# Patient Record
Sex: Male | Born: 1985 | Race: Black or African American | Hispanic: No | Marital: Single | Smoking: Current every day smoker
Health system: Southern US, Community
[De-identification: ages and names within clinical notes are randomized; demographics above are authoritative.]

---
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
## 2012-08-10 ENCOUNTER — Emergency Department (HOSPITAL_BASED_OUTPATIENT_CLINIC_OR_DEPARTMENT_OTHER)
Admission: EM | Admit: 2012-08-10 | Discharge: 2012-08-10 | Disposition: A | Discharge status: Home / Self Care | Payer: Self-pay | Attending: Emergency Medicine | Admitting: Emergency Medicine

## 2012-08-10 ENCOUNTER — Encounter (HOSPITAL_BASED_OUTPATIENT_CLINIC_OR_DEPARTMENT_OTHER): Payer: Self-pay | Admitting: *Deleted

## 2012-08-10 DIAGNOSIS — H109 Unspecified conjunctivitis: Secondary | ICD-10-CM

## 2012-08-10 DIAGNOSIS — F17200 Nicotine dependence, unspecified, uncomplicated: Secondary | ICD-10-CM | POA: Insufficient documentation

## 2012-08-10 MED ORDER — FLUORESCEIN SODIUM 1 MG OP STRP
ORAL_STRIP | OPHTHALMIC | Status: AC
  Filled 2012-08-10: qty 1

## 2012-08-10 MED ORDER — TETRACAINE HCL 0.5 % OP SOLN
OPHTHALMIC | Status: AC
  Filled 2012-08-10: qty 2

## 2012-08-10 MED ORDER — ERYTHROMYCIN 5 MG/GM OP OINT: TOPICAL_OINTMENT | Freq: Four times a day (QID) | OPHTHALMIC | Status: DC

## 2012-08-10 NOTE — ED Provider Notes (Addendum)
History     CSN: [id number]  Arrival date & time 08/10/12  0016   First MD Initiated Contact with Patient 08/10/12 0156      Chief Complaint  Patient presents with  . Eye Pain    (Consider location/radiation/quality/duration/timing/severity/associated sxs/prior treatment) Patient is a 26 y.o. male presenting with eye pain and eye problem. The history is provided by the patient.  Eye Pain This is a new problem. The current episode started 12 to 24 hours ago. The problem occurs constantly. The problem has not changed since onset.Pertinent negatives include no chest pain, no abdominal pain, no headaches and no shortness of breath. Nothing aggravates the symptoms. Nothing relieves the symptoms. He has tried nothing for the symptoms. The treatment provided no relief.  Eye Problem  This is a new problem. The current episode started 12 to 24 hours ago. The problem occurs constantly. The problem has not changed since onset.The left eye is affected.There was no injury mechanism. The pain is mild. There is no history of trauma to the eye. There is no known exposure to pink eye. He does not wear contacts. Associated symptoms include discharge. Pertinent negatives include no double vision. He has tried nothing for the symptoms. The treatment provided no relief.    History reviewed. No pertinent past medical history.  History reviewed. No pertinent past surgical history.  No family history on file.  History  Substance Use Topics  . Smoking status: Current Every Day Smoker  . Smokeless tobacco: Not on file  . Alcohol Use: No      Review of Systems  Eyes: Positive for pain and discharge. Negative for double vision and visual disturbance.  Respiratory: Negative for shortness of breath.   Cardiovascular: Negative for chest pain.  Gastrointestinal: Negative for abdominal pain.  Neurological: Negative for headaches.  All other systems reviewed and are negative.    Allergies  Review of  patient's allergies indicates no known allergies.  Home Medications  No current outpatient prescriptions on file.  BP 116/66  Pulse 62  Temp 97.9 F (36.6 C) (Oral)  Resp 16  Wt 160 lb (72.576 kg)  SpO2 100%  Physical Exam  Constitutional: He is oriented to person, place, and time. He appears well-developed and well-nourished.  HENT:  Head: Normocephalic and atraumatic.  Mouth/Throat: Oropharynx is clear and moist.  Eyes: Conjunctivae normal and EOM are normal. Pupils are equal, round, and reactive to light.  Slit lamp exam:      The right eye shows no corneal abrasion and no corneal flare.       The left eye shows no corneal abrasion and no corneal flare.  Neck: Normal range of motion. Neck supple.  Cardiovascular: Normal rate, regular rhythm and intact distal pulses.   Pulmonary/Chest: Effort normal and breath sounds normal. He has no wheezes. He has no rales.  Abdominal: Soft. Bowel sounds are normal. There is no tenderness. There is no rebound.  Musculoskeletal: Normal range of motion. He exhibits no edema.  Neurological: He is alert and oriented to person, place, and time.  Skin: Skin is warm and dry.  Psychiatric: He has a normal mood and affect.    ED Course  Procedures (including critical care time)  Labs Reviewed - No data to display No results found.   No diagnosis found.    MDM  Will treat as conjunctivitis follow up in 24 hours with ophthalmology patient verbalizes understanding and agrees to follow up  Jasmine Awe, MD 08/10/12 0248  Kasyn Rolph K Ashleigh Arya-Rasch, MD 08/10/12 [phone]

## 2012-08-10 NOTE — ED Notes (Signed)
Pt c/o left eye pain and drainage since yesterday.

## 2013-06-16 ENCOUNTER — Encounter (HOSPITAL_BASED_OUTPATIENT_CLINIC_OR_DEPARTMENT_OTHER): Payer: Self-pay | Admitting: Emergency Medicine

## 2013-06-16 ENCOUNTER — Emergency Department (HOSPITAL_BASED_OUTPATIENT_CLINIC_OR_DEPARTMENT_OTHER)
Admission: EM | Admit: 2013-06-16 | Discharge: 2013-06-16 | Disposition: A | Discharge status: Home / Self Care | Payer: Self-pay | Attending: Emergency Medicine | Admitting: Emergency Medicine

## 2013-06-16 DIAGNOSIS — F17200 Nicotine dependence, unspecified, uncomplicated: Secondary | ICD-10-CM | POA: Insufficient documentation

## 2013-06-16 DIAGNOSIS — S41009A Unspecified open wound of unspecified shoulder, initial encounter: Secondary | ICD-10-CM | POA: Insufficient documentation

## 2013-06-16 DIAGNOSIS — S21209A Unspecified open wound of unspecified back wall of thorax without penetration into thoracic cavity, initial encounter: Secondary | ICD-10-CM | POA: Insufficient documentation

## 2013-06-16 DIAGNOSIS — T07XXXA Unspecified multiple injuries, initial encounter: Secondary | ICD-10-CM

## 2013-06-16 NOTE — ED Notes (Signed)
PA at bedside suturing pt. 

## 2013-06-16 NOTE — ED Notes (Signed)
HPD at the bedside
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
## 2013-06-17 NOTE — ED Provider Notes (Signed)
  Medical screening examination/treatment/procedure(s) were performed by non-physician practitioner and as supervising physician I was immediately available for consultation/collaboration.  EKG Interpretation   None          Gerhard Munch, MD 06/17/13 0007

## 2013-07-05 ENCOUNTER — Encounter (HOSPITAL_BASED_OUTPATIENT_CLINIC_OR_DEPARTMENT_OTHER): Payer: Self-pay | Admitting: Emergency Medicine

## 2013-07-05 ENCOUNTER — Emergency Department (HOSPITAL_BASED_OUTPATIENT_CLINIC_OR_DEPARTMENT_OTHER)
Admission: EM | Admit: 2013-07-05 | Discharge: 2013-07-05 | Disposition: A | Discharge status: Home / Self Care | Payer: Self-pay | Attending: Emergency Medicine | Admitting: Emergency Medicine

## 2013-07-05 DIAGNOSIS — B9789 Other viral agents as the cause of diseases classified elsewhere: Secondary | ICD-10-CM | POA: Insufficient documentation

## 2013-07-05 DIAGNOSIS — F17200 Nicotine dependence, unspecified, uncomplicated: Secondary | ICD-10-CM | POA: Insufficient documentation

## 2013-07-05 DIAGNOSIS — H9209 Otalgia, unspecified ear: Secondary | ICD-10-CM | POA: Insufficient documentation

## 2013-07-05 DIAGNOSIS — B349 Viral infection, unspecified: Secondary | ICD-10-CM

## 2013-07-05 LAB — RAPID STREP SCREEN (MED CTR MEBANE ONLY): Streptococcus, Group A Screen (Direct): NEGATIVE

## 2013-07-05 MED ORDER — IBUPROFEN 800 MG PO TABS
800.0000 mg | ORAL_TABLET | Freq: Once | ORAL | Status: AC
  Administered 2013-07-05: 800 mg via ORAL
  Filled 2013-07-05: qty 1

## 2013-07-05 MED ORDER — NAPROXEN 375 MG PO TABS: 375.0000 mg | ORAL_TABLET | Freq: Two times a day (BID) | ORAL | Status: DC

## 2013-07-05 MED ORDER — LORATADINE-PSEUDOEPHEDRINE ER 10-240 MG PO TB24: 1.0000 | ORAL_TABLET | Freq: Every day | ORAL | Status: DC

## 2013-07-05 NOTE — ED Provider Notes (Signed)
CSN: [id number]     Arrival date & time 07/05/13  0106 History   First MD Initiated Contact with Patient 07/05/13 0116     Chief Complaint  Patient presents with  . Sore Throat  . Otalgia   (Consider location/radiation/quality/duration/timing/severity/associated sxs/prior Treatment) Patient is a 27 y.o. male presenting with pharyngitis and ear pain. The history is provided by the patient.  Sore Throat This is a new problem. The current episode started yesterday. The problem occurs constantly. The problem has not changed since onset.Pertinent negatives include no chest pain, no abdominal pain, no headaches and no shortness of breath. Nothing aggravates the symptoms. Nothing relieves the symptoms. Treatments tried: throat spray. The treatment provided no relief.  Otalgia Location:  Left Behind ear:  No abnormality Quality:  Burning Severity:  Moderate Onset quality:  Gradual Duration:  2 days Timing:  Constant Progression:  Unchanged Relieved by:  Nothing Worsened by:  Nothing tried Ineffective treatments:  None tried Associated symptoms: no abdominal pain, no fever and no headaches   Risk factors: no recent travel and no chronic ear infection     History reviewed. No pertinent past medical history. History reviewed. No pertinent past surgical history. History reviewed. No pertinent family history. History  Substance Use Topics  . Smoking status: Current Every Day Smoker -- 0.50 packs/day    Types: Cigarettes  . Smokeless tobacco: Not on file  . Alcohol Use: 3.6 oz/week    6 Cans of beer per week     Comment: socially    Review of Systems  Constitutional: Negative for fever.  HENT: Positive for ear pain.   Respiratory: Negative for shortness of breath.   Cardiovascular: Negative for chest pain.  Gastrointestinal: Negative for abdominal pain.  Neurological: Negative for headaches.  All other systems reviewed and are negative.    Allergies  Review of patient's  allergies indicates no known allergies.  Home Medications   Current Outpatient Rx  Name  Route  Sig  Dispense  Refill  . erythromycin ophthalmic ointment   Left Eye   Place into the left eye every 6 (six) hours.   3.5 g   0    BP 120/78  Pulse 68  Temp(Src) 98.4 F (36.9 C) (Oral)  Resp 18  Ht 5\' 11"  (1.803 m)  Wt 150 lb (68.04 kg)  BMI 20.93 kg/m2  SpO2 98% Physical Exam  Constitutional: He is oriented to person, place, and time. He appears well-developed and well-nourished. No distress.  HENT:  Head: Normocephalic and atraumatic.  Right Ear: No swelling. No mastoid tenderness. Tympanic membrane is not injected, not perforated, not erythematous, not retracted and not bulging. No hemotympanum.  Left Ear: No swelling. No mastoid tenderness. Tympanic membrane is not injected, not perforated, not erythematous, not retracted and not bulging. No hemotympanum.  Mouth/Throat: Oropharynx is clear and moist.  Eyes: Conjunctivae are normal. Pupils are equal, round, and reactive to light.  Neck: Normal range of motion. Neck supple.  Cardiovascular: Normal rate, regular rhythm and intact distal pulses.   Pulmonary/Chest: Effort normal and breath sounds normal. He has no wheezes. He has no rales.  Abdominal: Soft. Bowel sounds are normal. There is no tenderness. There is no rebound and no guarding.  Musculoskeletal: Normal range of motion.  Lymphadenopathy:    He has no cervical adenopathy.  Neurological: He is alert and oriented to person, place, and time.  Skin: Skin is warm and dry.  Psychiatric: He has a normal mood and affect.  ED Course  Procedures (including critical care time) Labs Review Labs Reviewed  RAPID STREP SCREEN   Imaging Review No results found.  EKG Interpretation   None       MDM  No diagnosis found. Strep negative ears normal.  Will treat with decongestant and nsaids.  Symptoms likely viral    Aletheia Tangredi K Ashlye Oviedo-Rasch, MD 07/05/13 [phone]

## 2013-07-05 NOTE — ED Notes (Signed)
Pt states he has had a sore throat, difficulty swallowing, left sided ear pain, chills x2 days.

## 2013-07-07 LAB — CULTURE, GROUP A STREP

## 2014-06-25 ENCOUNTER — Encounter (HOSPITAL_BASED_OUTPATIENT_CLINIC_OR_DEPARTMENT_OTHER): Payer: Self-pay | Admitting: *Deleted

## 2014-06-25 DIAGNOSIS — Z79899 Other long term (current) drug therapy: Secondary | ICD-10-CM | POA: Insufficient documentation

## 2014-06-25 DIAGNOSIS — Z791 Long term (current) use of non-steroidal anti-inflammatories (NSAID): Secondary | ICD-10-CM | POA: Insufficient documentation

## 2014-06-25 DIAGNOSIS — R599 Enlarged lymph nodes, unspecified: Secondary | ICD-10-CM | POA: Insufficient documentation

## 2014-06-25 DIAGNOSIS — L02413 Cutaneous abscess of right upper limb: Secondary | ICD-10-CM | POA: Insufficient documentation

## 2014-06-25 DIAGNOSIS — Z720 Tobacco use: Secondary | ICD-10-CM | POA: Insufficient documentation

## 2014-06-25 NOTE — ED Notes (Signed)
Pt c/o abscess to right high x 1 week

## 2014-06-26 ENCOUNTER — Emergency Department (HOSPITAL_BASED_OUTPATIENT_CLINIC_OR_DEPARTMENT_OTHER)
Admission: EM | Admit: 2014-06-26 | Discharge: 2014-06-26 | Disposition: A | Discharge status: Home / Self Care | Payer: Self-pay | Attending: Emergency Medicine | Admitting: Emergency Medicine

## 2014-06-26 ENCOUNTER — Encounter (HOSPITAL_BASED_OUTPATIENT_CLINIC_OR_DEPARTMENT_OTHER): Payer: Self-pay | Admitting: Emergency Medicine

## 2014-06-26 DIAGNOSIS — R599 Enlarged lymph nodes, unspecified: Secondary | ICD-10-CM

## 2014-06-26 DIAGNOSIS — L0291 Cutaneous abscess, unspecified: Secondary | ICD-10-CM

## 2014-06-26 MED ORDER — SULFAMETHOXAZOLE-TRIMETHOPRIM 800-160 MG PO TABS
1.0000 | ORAL_TABLET | Freq: Two times a day (BID) | ORAL | Status: DC
Start: 2014-06-26 — End: 2019-10-27

## 2014-06-26 MED ORDER — LIDOCAINE-EPINEPHRINE 2 %-1:100000 IJ SOLN
INTRAMUSCULAR | Status: AC
  Filled 2014-06-26: qty 1

## 2014-06-26 MED ORDER — IBUPROFEN 800 MG PO TABS
800.0000 mg | ORAL_TABLET | Freq: Once | ORAL | Status: AC
  Administered 2014-06-26: 800 mg via ORAL

## 2014-06-26 MED ORDER — IBUPROFEN 800 MG PO TABS
ORAL_TABLET | ORAL | Status: AC
  Filled 2014-06-26: qty 1

## 2014-06-26 MED ORDER — SULFAMETHOXAZOLE-TRIMETHOPRIM 800-160 MG PO TABS
1.0000 | ORAL_TABLET | Freq: Once | ORAL | Status: AC
  Administered 2014-06-26: 1 via ORAL

## 2014-06-26 MED ORDER — LIDOCAINE-EPINEPHRINE 2 %-1:100000 IJ SOLN
20.0000 mL | Freq: Once | INTRAMUSCULAR | Status: AC
  Administered 2014-06-26: 20 mL via INTRADERMAL

## 2014-06-26 MED ORDER — IBUPROFEN 800 MG PO TABS: 800.0000 mg | ORAL_TABLET | Freq: Three times a day (TID) | ORAL | Status: DC

## 2014-06-26 MED ORDER — SULFAMETHOXAZOLE-TRIMETHOPRIM 800-160 MG PO TABS
ORAL_TABLET | ORAL | Status: AC
  Filled 2014-06-26: qty 1

## 2014-06-26 MED ORDER — TRAMADOL HCL 50 MG PO TABS: 50.0000 mg | ORAL_TABLET | Freq: Four times a day (QID) | ORAL | Status: DC | PRN

## 2014-06-26 NOTE — Discharge Instructions (Signed)
Abscess °An abscess (boil or furuncle) is an infected area on or under the skin. This area is filled with yellowish-white fluid (pus) and other material (debris). °HOME CARE  °· Only take medicines as told by your doctor. °· If you were given antibiotic medicine, take it as directed. Finish the medicine even if you start to feel better. °· If gauze is used, follow your doctor's directions for changing the gauze. °· To avoid spreading the infection: °¨ Keep your abscess covered with a bandage. °¨ Wash your hands well. °¨ Do not share personal care items, towels, or whirlpools with others. °¨ Avoid skin contact with others. °· Keep your skin and clothes clean around the abscess. °· Keep all doctor visits as told. °GET HELP RIGHT AWAY IF:  °· You have more pain, puffiness (swelling), or redness in the wound site. °· You have more fluid or blood coming from the wound site. °· You have muscle aches, chills, or you feel sick. °· You have a fever. °MAKE SURE YOU:  °· Understand these instructions. °· Will watch your condition. °· Will get help right away if you are not doing well or get worse. °Document Released: 01/17/2008 Document Revised: 01/30/2012 Document Reviewed: 10/13/2011 °[employer]® Patient Information ©2015 [employer]. This information is not intended to replace advice given to you by your health care provider. Make sure you discuss any questions you have with your health care provider. ° °

## 2014-06-26 NOTE — ED Provider Notes (Signed)
CSN: [id number]     Arrival [id number] & time 06/25/14  2352 History   First MD Initiated Contact with Patient 06/26/14 0140     Chief Complaint  Patient presents with  . Abscess     (Consider location/radiation/quality/duration/timing/severity/associated sxs/prior Treatment) Patient is a 28 y.o. male presenting with abscess. The history is provided by the patient.  Abscess Location:  Leg Leg abscess location:  R upper leg (posterior) Abscess quality: painful, redness and warmth   Red streaking: no   Duration:  6 days Progression:  Worsening Pain details:    Quality:  Dull   Severity:  Mild   Timing:  Constant   Progression:  Worsening Chronicity:  New Context: not diabetes   Relieved by:  Nothing Worsened by:  Nothing tried Ineffective treatments:  None tried Associated symptoms: no fever and no vomiting   Risk factors: no family hx of MRSA     History reviewed. No pertinent past medical history. History reviewed. No pertinent past surgical history. History reviewed. No pertinent family history. History  Substance Use Topics  . Smoking status: Current Every Day Smoker -- 0.50 packs/day    Types: Cigarettes  . Smokeless tobacco: Not on file  . Alcohol Use: 3.6 oz/week    6 Cans of beer per week     Comment: socially    Review of Systems  Constitutional: Negative for fever.  Gastrointestinal: Negative for vomiting.  All other systems reviewed and are negative.     Allergies  Review of patient's allergies indicates no known allergies.  Home Medications   Prior to Admission medications   Medication Sig Start Date End Date Taking? Authorizing Provider  erythromycin ophthalmic ointment Place into the left eye every 6 (six) hours. 08/10/12   Valeria Boza K Marlan Steward-Rasch, MD  loratadine-pseudoephedrine (CLARITIN-D 24 HOUR) 10-240 MG per 24 hr tablet Take 1 tablet by mouth daily. 07/05/13   Faraz Ponciano K Renu Asby-Rasch, MD  naproxen (NAPROSYN) 375 MG tablet Take 1 tablet (375 mg  total) by mouth 2 (two) times daily. 07/05/13   Riannah Stagner K Natilie Krabbenhoft-Rasch, MD   BP 122/69 mmHg  Pulse 68  Temp(Src) 98.6 F (37 C) (Oral)  Resp 18  Ht 5\' 9"  (1.753 m)  Wt 140 lb (63.504 kg)  BMI 20.67 kg/m2  SpO2 100% Physical Exam  Constitutional: He is oriented to person, place, and time. He appears well-developed and well-nourished. No distress.  HENT:  Head: Normocephalic and atraumatic.  Mouth/Throat: Oropharynx is clear and moist.  Eyes: Conjunctivae are normal. Pupils are equal, round, and reactive to light.  Neck: Normal range of motion. Neck supple.  Cardiovascular: Normal rate, regular rhythm and intact distal pulses.   Pulmonary/Chest: Effort normal and breath sounds normal. He has no wheezes. He has no rales.  Abdominal: Soft. Bowel sounds are normal. There is no tenderness. There is no rebound and no guarding.  Isolated right groin lymph node.  No nodes in opposite groin  Musculoskeletal: Normal range of motion.  Lymphadenopathy:    He has no cervical adenopathy.  Neurological: He is alert and oriented to person, place, and time.  Skin: Skin is warm and dry.  Psychiatric: He has a normal mood and affect.    ED Course  Procedures (including critical care time) Labs Review Labs Reviewed - No data to display  Imaging Review No results found.   EKG Interpretation None      MDM   Final diagnoses:  None    INCISION AND DRAINAGE Performed by: Deanna ArtisPALUMBO-RASCH,Lurena Naeve  K Consent: Verbal consent obtained. Risks and benefits: risks, benefits and alternatives were discussed Type: abscess  Body area: posterior right thigh  Anesthesia: local infiltration  Incision was made with a scalpel.  Local anesthetic: lidocaine 1% withepinephrine  Anesthetic total: 4 ml  Complexity: complex Blunt dissection to break up loculations  Drainage: purulent  Drainage amount: copious  Patient tolerance: Patient tolerated the procedure well with no immediate  complications.   Suspect Reactive lymph node in the groin will treat with bactrim BID x 7 days and have patient rechecked with his PMD in 2 days.  Return for fevers, streaking or any concerning symptoms    Alya Smaltz K Sava Proby-Rasch, MD 06/26/14 [phone]

## 2014-06-26 NOTE — ED Notes (Signed)
C/o "bump" on rt groin x 3 days and one on er hip x 6 days

## 2016-11-06 ENCOUNTER — Encounter (HOSPITAL_BASED_OUTPATIENT_CLINIC_OR_DEPARTMENT_OTHER): Payer: Self-pay

## 2016-11-06 ENCOUNTER — Emergency Department (HOSPITAL_BASED_OUTPATIENT_CLINIC_OR_DEPARTMENT_OTHER)
Admission: EM | Admit: 2016-11-06 | Discharge: 2016-11-06 | Disposition: A | Discharge status: Home / Self Care | Payer: Self-pay | Attending: Emergency Medicine | Admitting: Emergency Medicine

## 2016-11-06 ENCOUNTER — Emergency Department (HOSPITAL_BASED_OUTPATIENT_CLINIC_OR_DEPARTMENT_OTHER): Payer: Self-pay

## 2016-11-06 DIAGNOSIS — M25531 Pain in right wrist: Secondary | ICD-10-CM

## 2016-11-06 DIAGNOSIS — R52 Pain, unspecified: Secondary | ICD-10-CM

## 2016-11-06 DIAGNOSIS — F17210 Nicotine dependence, cigarettes, uncomplicated: Secondary | ICD-10-CM | POA: Insufficient documentation

## 2016-11-06 MED ORDER — IBUPROFEN 800 MG PO TABS: 800.0000 mg | ORAL_TABLET | Freq: Three times a day (TID) | ORAL | 0 refills | Status: DC | PRN
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
## 2016-11-06 NOTE — Discharge Instructions (Signed)
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
## 2019-10-12 ENCOUNTER — Encounter (HOSPITAL_BASED_OUTPATIENT_CLINIC_OR_DEPARTMENT_OTHER): Payer: Self-pay

## 2019-10-12 ENCOUNTER — Emergency Department (HOSPITAL_BASED_OUTPATIENT_CLINIC_OR_DEPARTMENT_OTHER): Payer: Self-pay

## 2019-10-12 ENCOUNTER — Emergency Department (HOSPITAL_BASED_OUTPATIENT_CLINIC_OR_DEPARTMENT_OTHER)
Admission: EM | Admit: 2019-10-12 | Discharge: 2019-10-12 | Disposition: A | Discharge status: Home / Self Care | Payer: Self-pay | Attending: Emergency Medicine | Admitting: Emergency Medicine

## 2019-10-12 ENCOUNTER — Other Ambulatory Visit: Payer: Self-pay

## 2019-10-12 DIAGNOSIS — Y929 Unspecified place or not applicable: Secondary | ICD-10-CM | POA: Insufficient documentation

## 2019-10-12 DIAGNOSIS — Y999 Unspecified external cause status: Secondary | ICD-10-CM | POA: Insufficient documentation

## 2019-10-12 DIAGNOSIS — Y9389 Activity, other specified: Secondary | ICD-10-CM | POA: Insufficient documentation

## 2019-10-12 DIAGNOSIS — S61412A Laceration without foreign body of left hand, initial encounter: Secondary | ICD-10-CM | POA: Insufficient documentation

## 2019-10-12 DIAGNOSIS — W25XXXA Contact with sharp glass, initial encounter: Secondary | ICD-10-CM | POA: Insufficient documentation

## 2019-10-12 DIAGNOSIS — F17210 Nicotine dependence, cigarettes, uncomplicated: Secondary | ICD-10-CM | POA: Insufficient documentation

## 2019-10-12 DIAGNOSIS — Z79899 Other long term (current) drug therapy: Secondary | ICD-10-CM | POA: Insufficient documentation

## 2019-10-12 DIAGNOSIS — M79642 Pain in left hand: Secondary | ICD-10-CM

## 2019-10-12 MED ORDER — LIDOCAINE HCL (PF) 1 % IJ SOLN
10.0000 mL | Freq: Once | INTRAMUSCULAR | Status: AC
  Administered 2019-10-12: 14:00:00 10 mL
  Filled 2019-10-12: qty 10

## 2019-10-12 NOTE — ED Notes (Signed)
ED Provider at bedside for lac repair 
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
## 2019-10-12 NOTE — Discharge Instructions (Signed)
Please continue wearing splint and taping your two fingers together to prevent the sutures coming apart sooner than necessary  Avoid bending your two fingers as well Return to the ED in 1 week for suture removal Keep area clean and dry Return to the ED sooner if any signs of infection including redness/swelling around the wound, drainage of pus, fevers > 100.4, or chills

## 2019-10-12 NOTE — ED Notes (Signed)
Patient transported to X-ray 

## 2019-10-12 NOTE — ED Notes (Signed)
ED Provider at bedside. 

## 2019-10-27 ENCOUNTER — Other Ambulatory Visit: Payer: Self-pay

## 2019-10-27 ENCOUNTER — Emergency Department (HOSPITAL_BASED_OUTPATIENT_CLINIC_OR_DEPARTMENT_OTHER)
Admission: EM | Admit: 2019-10-27 | Discharge: 2019-10-27 | Disposition: A | Discharge status: Home / Self Care | Payer: Self-pay | Attending: Emergency Medicine | Admitting: Emergency Medicine

## 2019-10-27 ENCOUNTER — Encounter (HOSPITAL_BASED_OUTPATIENT_CLINIC_OR_DEPARTMENT_OTHER): Payer: Self-pay | Admitting: *Deleted

## 2019-10-27 DIAGNOSIS — X58XXXD Exposure to other specified factors, subsequent encounter: Secondary | ICD-10-CM | POA: Insufficient documentation

## 2019-10-27 DIAGNOSIS — S61412D Laceration without foreign body of left hand, subsequent encounter: Secondary | ICD-10-CM | POA: Insufficient documentation

## 2019-10-27 DIAGNOSIS — F17210 Nicotine dependence, cigarettes, uncomplicated: Secondary | ICD-10-CM | POA: Insufficient documentation

## 2019-10-27 DIAGNOSIS — Z4802 Encounter for removal of sutures: Secondary | ICD-10-CM

## 2019-10-27 NOTE — Discharge Instructions (Signed)
Use the buddy tape as directed.  You can use this for 1 more week. Return to the ED if for any signs of infection including redness, drainage, fevers.

## 2019-10-27 NOTE — ED Triage Notes (Signed)
Stitched done 14 days ago to left hand.
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
## 2021-03-21 ENCOUNTER — Encounter (HOSPITAL_BASED_OUTPATIENT_CLINIC_OR_DEPARTMENT_OTHER): Payer: Self-pay | Admitting: Emergency Medicine

## 2021-03-21 ENCOUNTER — Emergency Department (HOSPITAL_BASED_OUTPATIENT_CLINIC_OR_DEPARTMENT_OTHER)
Admission: EM | Admit: 2021-03-21 | Discharge: 2021-03-21 | Disposition: A | Discharge status: Home / Self Care | Payer: Self-pay | Attending: Emergency Medicine | Admitting: Emergency Medicine

## 2021-03-21 ENCOUNTER — Other Ambulatory Visit: Payer: Self-pay

## 2021-03-21 ENCOUNTER — Other Ambulatory Visit (HOSPITAL_BASED_OUTPATIENT_CLINIC_OR_DEPARTMENT_OTHER): Payer: Self-pay

## 2021-03-21 DIAGNOSIS — B029 Zoster without complications: Secondary | ICD-10-CM | POA: Insufficient documentation

## 2021-03-21 DIAGNOSIS — F17210 Nicotine dependence, cigarettes, uncomplicated: Secondary | ICD-10-CM | POA: Insufficient documentation

## 2021-03-21 MED ORDER — VALACYCLOVIR HCL 1 G PO TABS
1000.0000 mg | ORAL_TABLET | Freq: Three times a day (TID) | ORAL | 0 refills | Status: AC
  Filled 2021-03-21: qty 21, 7d supply, fill #0

## 2021-03-21 NOTE — Discharge Instructions (Addendum)
Shingles please take Valtrex 3 times daily for the next 7 days.  You can use Tylenol or ibuprofen to help with pain or discomfort.  Follow-up with your primary care doctor if rash is not improving.  Please have your wife contact her OB/GYN to let them know that she has today. Been exposed to shingles.

## 2021-03-21 NOTE — ED Provider Notes (Signed)
[facility] EMERGENCY DEPARTMENT Provider Note   CSN: [id number] Arrival date & time: 03/21/21  1013     History Chief Complaint  Patient presents with   Rash    Jack Rodriguez is a 35 y.o. male.  Jack Rodriguez is a 35 y.o. male who is otherwise healthy, presents to the emergency department for evaluation of rash.  Patient reports rash showed up initially 4 or 5 days ago.  He reports it started on his back under his left shoulder blade and wraps around to his left mid chest, does not cross over to the right side.  He reports it is intermittently been itchy and somewhat painful with some shooting pains.  He does not have any similar lesions or rash elsewhere.  Denies any fever, chills, cough, congestion, headache, lymphadenopathy or prodromal symptoms.  No known sick contacts.  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  Reports he was worried when he got back from a trip and hurt all the news about monkey pox but has not had any known exposures.  Is not sexually active with men.  Does report that his wife is currently pregnant but does not have similar rash.  He is not sure if he had chickenpox as a child.  Tried hydrocortisone cream without improvement.  The history is provided by the patient.      No past medical history on file.  There are no problems to display for this patient.   No past surgical history on file.     No family history on file.  Social History   Tobacco Use   Smoking status: Every Day    Packs/day: 0.50    Types: Cigarettes   Smokeless tobacco: Never  Substance Use Topics   Alcohol use: Yes    Alcohol/week: 6.0 standard drinks    Types: 6 Cans of beer per week    Comment: socially   Drug use: No    Home Medications Prior to Admission medications   Not on File    Allergies    Patient has no known allergies.  Review of Systems   Review of Systems  Constitutional:   Negative for chills and fever.  HENT: Negative.    Eyes:  Negative for visual disturbance.  Respiratory:  Negative for cough and shortness of breath.   Cardiovascular:  Negative for chest pain.  Gastrointestinal:  Negative for abdominal pain, nausea and vomiting.  Skin:  Positive for rash.  Neurological:  Negative for headaches.  All other systems reviewed and are negative.  Physical Exam Updated Vital Signs BP 136/79 (BP Location: Right Arm)   Pulse 69   Temp 98.7 F (37.1 C) (Oral)   Resp 18   SpO2 100%   Physical Exam Vitals and nursing note reviewed.  Constitutional:      General: He is not in acute distress.    Appearance: Normal appearance. He is well-developed. He is not ill-appearing or diaphoretic.  HENT:     Head: Normocephalic and atraumatic.  Eyes:     General:        Right eye: No discharge.        Left eye: No discharge.  Pulmonary:     Effort: Pulmonary effort is normal. No respiratory distress.  Musculoskeletal:     Comments: Dermatomal rash present on the left along the T6 dermatome with some new vesicular lesions on the anterior chest wall and  some scabbed over lesions over the back with surrounding erythema, no pustules, no similar lesions elsewhere, rash does not cross midline.  Neurological:     Mental Status: He is alert and oriented to person, place, and time.     Coordination: Coordination normal.  Psychiatric:        Mood and Affect: Mood normal.        Behavior: Behavior normal.    ED Results / Procedures / Treatments   Labs (all labs ordered are listed, but only abnormal results are displayed) Labs Reviewed - No data to display  EKG None  Radiology No results found.  Procedures Procedures   Medications Ordered in ED Medications - No data to display  ED Course  I have reviewed the triage vital signs and the nursing notes.  Pertinent labs & imaging results that were available during my care of the patient were reviewed by me and  considered in my medical decision making (see chart for details).    MDM Rules/Calculators/A&P                           Rash is tender with grouped clear vesicles on an erythematous base located along dermatome T6 unilaterally on the left.   Pt without signs of CNS involvement and there is no involvement of the face or eyes; no concern for opthalmic zoster.  Will discharge home with pain management and valtrex.  Also recommend calamine lotion and cool compresses as needed for pain control   Final Clinical Impression(s) / ED Diagnoses Final diagnoses:  Herpes zoster without complication    Rx / DC Orders ED Discharge Orders     None        Legrand Rams 03/21/21 1129    Gwyneth Sprout, MD 03/22/21 2332

## 2021-03-21 NOTE — ED Triage Notes (Signed)
Patient with rash from mid left chest around rib cage to scapular area. Itching and some shooting pains endorsed. No open areas. All dry, raised.

## 2023-08-16 ENCOUNTER — Encounter (HOSPITAL_BASED_OUTPATIENT_CLINIC_OR_DEPARTMENT_OTHER): Payer: Self-pay | Admitting: Urology

## 2023-08-16 ENCOUNTER — Other Ambulatory Visit: Payer: Self-pay

## 2023-08-16 DIAGNOSIS — Z202 Contact with and (suspected) exposure to infections with a predominantly sexual mode of transmission: Secondary | ICD-10-CM | POA: Insufficient documentation

## 2023-08-16 DIAGNOSIS — R300 Dysuria: Secondary | ICD-10-CM | POA: Insufficient documentation

## 2023-08-16 LAB — URINALYSIS, MICROSCOPIC (REFLEX): WBC, UA: >50 WBC/hpf (ref 0–5)

## 2023-08-16 LAB — URINALYSIS, ROUTINE W REFLEX MICROSCOPIC
Bilirubin Urine: NEGATIVE
Glucose, UA: NEGATIVE mg/dL
Ketones, ur: NEGATIVE mg/dL
Nitrite: NEGATIVE
Protein, ur: NEGATIVE mg/dL
Specific Gravity, Urine: 1.02 (ref 1.005–1.030)
pH: 7 (ref 5.0–8.0)

## 2023-08-16 NOTE — ED Triage Notes (Signed)
 Pt states penile discharge that started 2 weeks ago  Slight pain with urination  Has had unprotected sex recently

## 2023-08-17 ENCOUNTER — Emergency Department (HOSPITAL_BASED_OUTPATIENT_CLINIC_OR_DEPARTMENT_OTHER): Payer: Self-pay

## 2023-08-17 ENCOUNTER — Emergency Department (HOSPITAL_BASED_OUTPATIENT_CLINIC_OR_DEPARTMENT_OTHER)
Admission: EM | Admit: 2023-08-17 | Discharge: 2023-08-17 | Disposition: A | Discharge status: Home / Self Care | Payer: Self-pay | Attending: Emergency Medicine | Admitting: Emergency Medicine

## 2023-08-17 DIAGNOSIS — Z202 Contact with and (suspected) exposure to infections with a predominantly sexual mode of transmission: Secondary | ICD-10-CM

## 2023-08-17 LAB — CBC WITH DIFFERENTIAL/PLATELET
Abs Immature Granulocytes: 0.02 10*3/uL (ref 0.00–0.07)
Basophils Absolute: 0 10*3/uL (ref 0.0–0.1)
Basophils Relative: 0 %
Eosinophils Absolute: 0.2 10*3/uL (ref 0.0–0.5)
Eosinophils Relative: 3 %
HCT: 38.4 % — ABNORMAL LOW (ref 39.0–52.0)
Hemoglobin: 12.4 g/dL — ABNORMAL LOW (ref 13.0–17.0)
Immature Granulocytes: 0 %
Lymphocytes Relative: 33 %
Lymphs Abs: 2.4 10*3/uL (ref 0.7–4.0)
MCH: 29.7 pg (ref 26.0–34.0)
MCHC: 32.3 g/dL (ref 30.0–36.0)
MCV: 92.1 fL (ref 80.0–100.0)
Monocytes Absolute: 0.7 10*3/uL (ref 0.1–1.0)
Monocytes Relative: 9 %
Neutro Abs: 4 10*3/uL (ref 1.7–7.7)
Neutrophils Relative %: 55 %
Platelets: 243 10*3/uL (ref 150–400)
RBC: 4.17 MIL/uL — ABNORMAL LOW (ref 4.22–5.81)
RDW: 12.4 % (ref 11.5–15.5)
WBC: 7.3 10*3/uL (ref 4.0–10.5)
nRBC: 0 % (ref 0.0–0.2)

## 2023-08-17 LAB — RPR: RPR Ser Ql: NONREACTIVE

## 2023-08-17 LAB — HIV ANTIBODY (ROUTINE TESTING W REFLEX): HIV Screen 4th Generation wRfx: NONREACTIVE

## 2023-08-17 LAB — BASIC METABOLIC PANEL
Anion gap: 7 (ref 5–15)
BUN: 18 mg/dL (ref 6–20)
CO2: 26 mmol/L (ref 22–32)
Calcium: 9 mg/dL (ref 8.9–10.3)
Chloride: 103 mmol/L (ref 98–111)
Creatinine, Ser: 0.8 mg/dL (ref 0.61–1.24)
GFR, Estimated: >60 mL/min (ref >60)
Glucose, Bld: 104 mg/dL — ABNORMAL HIGH (ref 70–99)
Potassium: 3.7 mmol/L (ref 3.5–5.1)
Sodium: 136 mmol/L (ref 135–145)

## 2023-08-17 MED ORDER — CEFTRIAXONE SODIUM 500 MG IJ SOLR
500.0000 mg | Freq: Once | INTRAMUSCULAR | Status: AC
  Administered 2023-08-17: 500 mg via INTRAMUSCULAR
  Filled 2023-08-17: qty 500

## 2023-08-17 MED ORDER — DOXYCYCLINE HYCLATE 100 MG PO CAPS
100.0000 mg | ORAL_CAPSULE | Freq: Two times a day (BID) | ORAL | 0 refills | Status: AC
Start: 2023-08-17 — End: ?

## 2023-08-17 MED ORDER — DOXYCYCLINE HYCLATE 100 MG PO TABS
100.0000 mg | ORAL_TABLET | Freq: Once | ORAL | Status: AC
Start: 2023-08-17 — End: 2023-08-17
  Administered 2023-08-17: 100 mg via ORAL
  Filled 2023-08-17: qty 1

## 2023-08-17 MED ORDER — CEFTRIAXONE SODIUM 500 MG IJ SOLR: 250.0000 mg | Freq: Once | INTRAMUSCULAR | Status: DC

## 2023-08-17 NOTE — ED Provider Notes (Signed)
 [place] [facility] DEPARTMENT AT [facility] Provider Note   CSN: [id number] Arrival date & time: 08/16/23  2137     History  Chief Complaint  Patient presents with   Exposure to STD    Jack Rodriguez is a 38 y.o. male.  1 week of penile discharge after new sexual partner.  Dysuria.  Discharge is mucus..  Does have some pain with urination but no blood in the urine.  No fever, chills, nausea or vomiting.  No abdominal pain or back pain. Also noticed a bump draining to right scrotum about 2 days ago.  Some purulence noted. No other medical history or regular medications.  The history is provided by the patient.  Exposure to STD Pertinent negatives include no chest pain, no abdominal pain and no shortness of breath.       Home Medications Prior to Admission medications   Medication Sig Start Date End Date Taking? Authorizing Provider  valACYclovir  (VALTREX ) 1000 MG tablet Take 1 tablet (1,000 mg total) by mouth 3 (three) times daily. 03/21/21   Kehrli, Kelsey F, PA-C      Allergies    Patient has no known allergies.    Review of Systems   Review of Systems  Constitutional:  Negative for activity change, appetite change and fever.  HENT:  Negative for congestion.   Respiratory:  Negative for cough, chest tightness and shortness of breath.   Cardiovascular:  Negative for chest pain.  Gastrointestinal:  Negative for abdominal pain, nausea and vomiting.  Genitourinary:  Positive for dysuria and penile discharge.  Musculoskeletal:  Negative for arthralgias and myalgias.  Skin:  Positive for wound.  Neurological:  Negative for dizziness and numbness.   all other systems are negative except as noted in the HPI and PMH.   Physical Exam Updated Vital Signs BP 112/73 (BP Location: Left Arm)   Pulse 70   Temp 98.1 F (36.7 C)   Resp 16   Ht 5' 11 (1.803 m)   Wt 65.8 kg   SpO2 99%   BMI 20.23 kg/m  Physical Exam Vitals and nursing note reviewed.   Constitutional:      General: He is not in acute distress.    Appearance: He is well-developed.  HENT:     Head: Normocephalic and atraumatic.     Mouth/Throat:     Pharynx: No oropharyngeal exudate.  Eyes:     Conjunctiva/sclera: Conjunctivae normal.     Pupils: Pupils are equal, round, and reactive to light.  Neck:     Comments: No meningismus. Cardiovascular:     Rate and Rhythm: Normal rate and regular rhythm.     Heart sounds: Normal heart sounds. No murmur heard. Pulmonary:     Effort: Pulmonary effort is normal. No respiratory distress.     Breath sounds: Normal breath sounds.  Abdominal:     Palpations: Abdomen is soft.     Tenderness: There is no abdominal tenderness. There is no guarding or rebound.  Genitourinary:    Comments: Circumcised.  No gross discharge.  Draining pustule right hemiscrotum.  No testicular tenderness Musculoskeletal:        General: No tenderness. Normal range of motion.     Cervical back: Normal range of motion and neck supple.  Skin:    General: Skin is warm.  Neurological:     Mental Status: He is alert and oriented to person, place, and time.     Cranial Nerves: No cranial nerve deficit.  Motor: No abnormal muscle tone.     Coordination: Coordination normal.     Comments:  5/5 strength throughout. CN 2-12 intact.Equal grip strength.   Psychiatric:        Behavior: Behavior normal.     ED Results / Procedures / Treatments   Labs (all labs ordered are listed, but only abnormal results are displayed) Labs Reviewed  URINALYSIS, ROUTINE W REFLEX MICROSCOPIC - Abnormal; Notable for the following components:      Result Value   APPearance CLOUDY (*)    Hgb urine dipstick TRACE (*)    Leukocytes,Ua LARGE (*)    All other components within normal limits  URINALYSIS, MICROSCOPIC (REFLEX) - Abnormal; Notable for the following components:   Bacteria, UA FEW (*)    All other components within normal limits  CBC WITH  DIFFERENTIAL/PLATELET - Abnormal; Notable for the following components:   RBC 4.17 (*)    Hemoglobin 12.4 (*)    HCT 38.4 (*)    All other components within normal limits  BASIC METABOLIC PANEL - Abnormal; Notable for the following components:   Glucose, Bld 104 (*)    All other components within normal limits  HIV ANTIBODY (ROUTINE TESTING W REFLEX)  RPR  GC/CHLAMYDIA PROBE AMP ([place]) NOT AT [facility]    EKG None  Radiology US  SCROTUM W/DOPPLER Result Date: 08/17/2023 CLINICAL DATA:  Swelling. EXAM: SCROTAL ULTRASOUND DOPPLER ULTRASOUND OF THE TESTICLES TECHNIQUE: Complete ultrasound examination of the testicles, epididymis, and other scrotal structures was performed. Color and spectral Doppler ultrasound were also utilized to evaluate blood flow to the testicles. COMPARISON:  None Available. FINDINGS: Right testicle Measurements: 3.4 x 2.1 x 2.6 cm. Homogeneous echogenicity. Normal blood flow. No mass or microlithiasis visualized. Left testicle Measurements: 3.5 x 1.6 x 2.6 cm. Homogeneous echogenicity. Normal blood flow. No mass or microlithiasis visualized. Right epididymis: Tiny 2 mm epididymal head cyst. Otherwise normal in size and appearance. Normal blood flow. Left epididymis: Tiny 2 mm epididymal head cyst. Otherwise normal in size and appearance. Normal blood flow. Hydrocele: Small bilateral Varicocele:  Small on the left. Pulsed Doppler interrogation of both testes demonstrates normal low resistance arterial and venous waveforms bilaterally. IMPRESSION: 1. Small bilateral hydroceles. 2. Tiny 2 mm epididymal head cysts. These need no further imaging follow-up. 3. Left-sided varicoceles. Electronically Signed   By: Andrea Gasman M.D.   On: 08/17/2023 01:58    Procedures Procedures    Medications Ordered in ED Medications  cefTRIAXone  (ROCEPHIN ) injection 250 mg (has no administration in time range)  doxycycline  (VIBRA -TABS) tablet 100 mg (has no administration in time range)     ED Course/ Medical Decision Making/ A&P                                 Medical Decision Making Amount and/or Complexity of Data Reviewed Labs: ordered. Decision-making details documented in ED Course. Radiology: ordered and independent interpretation performed. Decision-making details documented in ED Course. ECG/medicine tests: ordered and independent interpretation performed. Decision-making details documented in ED Course.  Risk Prescription drug management.   Penile discharge for the past 1 week, draining pustule to right scrotum for the past 2 days.  No fever.  Abdomen soft and nontender.  Will treat empirically for gonorrhea and chlamydia.  UA consistent with urethritis.   No significant leukocytosis.  Ultrasound obtained shows no drainable abscess.  Does show bilateral hydroceles and varicoceles.  No evidence of  testicular torsion or drainable abscess.  Will treat for suspected folliculitis as well as STD exposure.  Discussed follow-up with health department, using a condom with every sexual encounter.  Follow-up HIV and RPR test later today on MyChart.  Return Precautions discussed.        Final Clinical Impression(s) / ED Diagnoses Final diagnoses:  None    Rx / DC Orders ED Discharge Orders     None         Orah Sonnen, Garnette, MD 08/17/23 [id number]

## 2023-08-17 NOTE — Discharge Instructions (Signed)
 Use a condom with every sexual encounter.  Follow-up with the health department and your primary doctor.  Return to the ED with new or worsening symptoms.

## 2023-08-20 LAB — GC/CHLAMYDIA PROBE AMP (~~LOC~~) NOT AT ARMC
Chlamydia: POSITIVE — AB
Comment: NEGATIVE
Comment: NORMAL
Neisseria Gonorrhea: POSITIVE — AB
